# Patient Record
Sex: Male | Born: 1995 | Race: White | Hispanic: No | Marital: Single | Smoking: Never smoker
Health system: Southern US, Community
[De-identification: ages and names within clinical notes are randomized; demographics above are authoritative.]

---
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
## 2019-10-02 ENCOUNTER — Other Ambulatory Visit: Payer: Self-pay

## 2019-10-02 ENCOUNTER — Ambulatory Visit: Payer: Self-pay | Admitting: Physician Assistant

## 2019-10-02 DIAGNOSIS — Z113 Encounter for screening for infections with a predominantly sexual mode of transmission: Secondary | ICD-10-CM

## 2019-10-02 LAB — GRAM STAIN

## 2019-10-03 ENCOUNTER — Encounter: Payer: Self-pay | Admitting: Physician Assistant

## 2019-10-03 NOTE — Progress Notes (Signed)
    STI clinic/screening visit  Subjective:  Jeffrey Patrick is a 23 y.o. male being seen today for an STI screening visit. The patient reports they do not have symptoms.  Patient has the following medical conditions:  There are no active problems to display for this patient.    Chief Complaint  Patient presents with  . SEXUALLY TRANSMITTED DISEASE    HPI  Patient reports that he is not having any symptoms but would like a screening today.    See flowsheet for further details and programmatic requirements.    The following portions of the patient's history were reviewed and updated as appropriate: allergies, current medications, past medical history, past social history, past surgical history and problem list.  Objective:  There were no vitals filed for this visit.  Physical Exam Constitutional:      General: He is not in acute distress.    Appearance: Normal appearance.  HENT:     Head: Normocephalic and atraumatic.     Mouth/Throat:     Mouth: Mucous membranes are moist.     Pharynx: Oropharynx is clear. No oropharyngeal exudate or posterior oropharyngeal erythema.  Eyes:     Conjunctiva/sclera: Conjunctivae normal.  Neck:     Musculoskeletal: Neck supple.  Pulmonary:     Effort: Pulmonary effort is normal.  Abdominal:     Palpations: Abdomen is soft. There is no mass.     Tenderness: There is no abdominal tenderness. There is no guarding or rebound.  Genitourinary:    Penis: Normal.      Scrotum/Testes: Normal.     Comments: Pubic area without nits, lice, edema, erythema, lesions and inguinal adenopathy. Penis circumcised and without discharge at meatus. Lymphadenopathy:     Cervical: No cervical adenopathy.  Skin:    General: Skin is warm and dry.     Findings: No bruising, erythema, lesion or rash.  Neurological:     Mental Status: He is alert and oriented to person, place, and time.  Psychiatric:        Mood and Affect: Mood normal.        Behavior:  Behavior normal.        Thought Content: Thought content normal.        Judgment: Judgment normal.       Assessment and Plan:  Jeffrey Patrick is a 23 y.o. male presenting to the [facility] for STI screening  1. Screening for STD (sexually transmitted disease) Patient into clinic for screening and is without symptoms.  Rec condoms with all sex. Reviewed Gram stain results and no treatment indicated today. Await test results.  Counseled that RN will call if needs to RTC for any treatment once results are back. - Gram stain - HIV [facility] - Syphilis Serology, [facility] - Gonococcus culture     No follow-ups on file.  No future appointments.  Jerene Dilling, PA

## 2019-10-06 LAB — GONOCOCCUS CULTURE

## 2020-04-19 ENCOUNTER — Other Ambulatory Visit: Payer: Self-pay

## 2020-04-19 ENCOUNTER — Ambulatory Visit
Admission: EM | Admit: 2020-04-19 | Discharge: 2020-04-19 | Disposition: A | Discharge status: Home / Self Care | Payer: 59 | Attending: Family Medicine | Admitting: Family Medicine

## 2020-04-19 ENCOUNTER — Encounter: Payer: Self-pay | Admitting: Emergency Medicine

## 2020-04-19 DIAGNOSIS — H00014 Hordeolum externum left upper eyelid: Secondary | ICD-10-CM

## 2020-04-19 DIAGNOSIS — L03213 Periorbital cellulitis: Secondary | ICD-10-CM

## 2020-04-19 MED ORDER — AMOXICILLIN-POT CLAVULANATE 875-125 MG PO TABS: 1.0000 | ORAL_TABLET | Freq: Two times a day (BID) | ORAL | 0 refills | Status: AC

## 2020-04-19 MED ORDER — ERYTHROMYCIN 5 MG/GM OP OINT: 1.0000 "application " | TOPICAL_OINTMENT | Freq: Three times a day (TID) | OPHTHALMIC | 0 refills | Status: AC

## 2020-04-19 NOTE — Discharge Instructions (Addendum)
Take medication as prescribed. Rest. Drink plenty of fluids. Warm compresses. Avoid rubbing.   Follow-up with ophthalmology as needed.  Follow up with your primary care physician this week as needed. Return to Urgent care for new or worsening concerns.

## 2020-04-19 NOTE — ED Triage Notes (Signed)
Patient c/o left eye pain and drainage since yesterday.  Patient reports some swelling in his left upper eyelid.

## 2020-04-19 NOTE — ED Provider Notes (Signed)
[facility] ____________________________________________  Time seen: Approximately 10:13 AM  I have reviewed the triage vital signs and the nursing notes.   HISTORY  Chief Complaint Eye Pain (left)   HPI OMEED Patrick is a 24 y.o. male presenting for evaluation of left eye discomfort.  Reports since yesterday he had some discomfort to the left upper inner eyelid and states increased today with some swelling.  States the eye itself feels fine.  Denies abrupt onset.  Denies known foreign body or injury.  States he woke up with this yesterday.  Felt fine going to bed Thursday night.  Has had some intermittent drainage left eye.  Denies vision change, vision loss, cough, congestion or fevers.  Does not wear glasses or contacts.  Reports otherwise doing well.  Denies alleviating measures.  Denies aggravating factors.   History reviewed. No pertinent past medical history.  There are no problems to display for this patient.   History reviewed. No pertinent surgical history.   No current facility-administered medications for this encounter.  Current Outpatient Medications:  .  amoxicillin-clavulanate (AUGMENTIN) 875-125 MG tablet, Take 1 tablet by mouth every 12 (twelve) hours for 7 days., Disp: 14 tablet, Rfl: 0 .  erythromycin ophthalmic ointment, Place 1 application into the left eye 3 (three) times daily. For seven days, Disp: 3.5 g, Rfl: 0  Allergies Patient has no known allergies.  Family History  Problem Relation Age of Onset  . Healthy Mother   . Healthy Father     Social History Social History   Tobacco Use  . Smoking status: Never Smoker  . Smokeless tobacco: Current User    Types: Chew  Substance Use Topics  . Alcohol use: Yes    Comment: occasionally  . Drug use: Not Currently    Types: Marijuana    Review of Systems Constitutional: No fever/chills Eyes: No visual changes.  Positive eye complaints as above. ENT: No sore  throat. Cardiovascular: Denies chest pain. Respiratory: Denies shortness of breath. Musculoskeletal: Negative for back pain. Skin: Negative for rash.   ____________________________________________   PHYSICAL EXAM:  VITAL SIGNS: ED Triage Vitals  Enc Vitals Group     BP 04/19/20 0904 127/86     Pulse --      Resp 04/19/20 0904 16     Temp 04/19/20 0904 98.2 F (36.8 C)     Temp Source 04/19/20 0904 Oral     SpO2 04/19/20 0904 100 %     Weight 04/19/20 0901 194 lb (88 kg)     Height 04/19/20 0901 6' (1.829 m)     Head Circumference --      Peak Flow --      Pain Score 04/19/20 0901 2     Pain Loc --      Pain Edu? --      Excl. in GC? --       Visual Acuity  Right Eye Distance: 20/20 uncorrected Left Eye Distance: 20/20 uncorrected Bilateral Distance: 20/20 uncorrected    Constitutional: Alert and oriented. Well appearing and in no acute distress. Eyes: Conjunctivae are normal.  No foreign bodies noted bilaterally.  No discharge bilaterally.  PERRL. EOMI. no pain with EOMs.  Left upper inner eyelid stye present without active drainage, mild tenderness with mild surrounding medial erythema, no further surrounding erythema, no further tenderness, medial mild edema to eyelid, no other edema. ENT      Head: Normocephalic and atraumatic. Respiratory: Normal respiratory effort without tachypnea nor retractions.  Musculoskeletal: Steady gait Neurologic:  Normal speech and language.  Speech is normal. No gait instability.  Skin:  Skin is warm, dry and intact. No rash noted. Psychiatric: Mood and affect are normal. Speech and behavior are normal. Patient exhibits appropriate insight and judgment   ___________________________________________   LABS (all labs ordered are listed, but only abnormal results are displayed)  Labs Reviewed - No data to display   PROCEDURES Procedures    INITIAL IMPRESSION / ASSESSMENT AND PLAN / ED COURSE  Pertinent labs & imaging results  that were available during my care of the patient were reviewed by me and considered in my medical decision making (see chart for details).  Well-appearing patient.  Left eye eyelid stye present, concern for mild secondary preseptal cellulitis.  Will treat with oral Augmentin and erythromycin ophthalmic ointment.  Encourage warm compresses, keeping clean and close monitoring.  Discussed strict follow-up and return parameters.Discussed indication, risks and benefits of medications with patient.   Discussed follow up with Primary care physician or ophthalmology this week as needed.  Discussed follow up and return parameters including no resolution or any worsening concerns. Patient verbalized understanding and agreed to plan.   ____________________________________________   FINAL CLINICAL IMPRESSION(S) / ED DIAGNOSES  Final diagnoses:  Hordeolum externum of left upper eyelid  Preseptal cellulitis of left upper eyelid     ED Discharge Orders         Ordered    amoxicillin-clavulanate (AUGMENTIN) 875-125 MG tablet  Every 12 hours     04/19/20 0931    erythromycin ophthalmic ointment  3 times daily     04/19/20 0931           Note: This dictation was prepared with Dragon dictation along with smaller phrase technology. Any transcriptional errors that result from this process are unintentional.         Marylene Land, NP 04/19/20 1149

## 2023-03-31 ENCOUNTER — Emergency Department: Payer: 59

## 2023-03-31 ENCOUNTER — Encounter: Payer: Self-pay | Admitting: Radiology

## 2023-03-31 ENCOUNTER — Emergency Department
Admission: EM | Admit: 2023-03-31 | Discharge: 2023-03-31 | Disposition: A | Discharge status: Home / Self Care | Payer: 59 | Attending: Emergency Medicine | Admitting: Emergency Medicine

## 2023-03-31 ENCOUNTER — Other Ambulatory Visit: Payer: Self-pay

## 2023-03-31 DIAGNOSIS — R109 Unspecified abdominal pain: Secondary | ICD-10-CM | POA: Insufficient documentation

## 2023-03-31 DIAGNOSIS — M549 Dorsalgia, unspecified: Secondary | ICD-10-CM | POA: Insufficient documentation

## 2023-03-31 LAB — CBC WITH DIFFERENTIAL/PLATELET
Abs Immature Granulocytes: 0.01 10*3/uL (ref 0.00–0.07)
Basophils Absolute: 0 10*3/uL (ref 0.0–0.1)
Basophils Relative: 1 %
Eosinophils Absolute: 0.4 10*3/uL (ref 0.0–0.5)
Eosinophils Relative: 9 %
HCT: 43.8 % (ref 39.0–52.0)
Hemoglobin: 15 g/dL (ref 13.0–17.0)
Immature Granulocytes: 0 %
Lymphocytes Relative: 43 %
Lymphs Abs: 2.1 10*3/uL (ref 0.7–4.0)
MCH: 30.1 pg (ref 26.0–34.0)
MCHC: 34.2 g/dL (ref 30.0–36.0)
MCV: 87.8 fL (ref 80.0–100.0)
Monocytes Absolute: 0.4 10*3/uL (ref 0.1–1.0)
Monocytes Relative: 7 %
Neutro Abs: 1.9 10*3/uL (ref 1.7–7.7)
Neutrophils Relative %: 40 %
Platelets: 251 10*3/uL (ref 150–400)
RBC: 4.99 MIL/uL (ref 4.22–5.81)
RDW: 12.5 % (ref 11.5–15.5)
WBC: 4.8 10*3/uL (ref 4.0–10.5)
nRBC: 0 % (ref 0.0–0.2)

## 2023-03-31 LAB — COMPREHENSIVE METABOLIC PANEL
ALT: 40 U/L (ref 0–44)
AST: 28 U/L (ref 15–41)
Albumin: 4.6 g/dL (ref 3.5–5.0)
Alkaline Phosphatase: 71 U/L (ref 38–126)
Anion gap: 7 (ref 5–15)
BUN: 11 mg/dL (ref 6–20)
CO2: 28 mmol/L (ref 22–32)
Calcium: 9.9 mg/dL (ref 8.9–10.3)
Chloride: 106 mmol/L (ref 98–111)
Creatinine, Ser: 0.97 mg/dL (ref 0.61–1.24)
GFR, Estimated: >60 mL/min (ref >60)
Glucose, Bld: 110 mg/dL — ABNORMAL HIGH (ref 70–99)
Potassium: 4.1 mmol/L (ref 3.5–5.1)
Sodium: 141 mmol/L (ref 135–145)
Total Bilirubin: 0.7 mg/dL (ref 0.3–1.2)
Total Protein: 7.5 g/dL (ref 6.5–8.1)

## 2023-03-31 LAB — URINALYSIS, ROUTINE W REFLEX MICROSCOPIC
Bilirubin Urine: NEGATIVE
Glucose, UA: NEGATIVE mg/dL
Hgb urine dipstick: NEGATIVE
Ketones, ur: NEGATIVE mg/dL
Leukocytes,Ua: NEGATIVE
Nitrite: NEGATIVE
Protein, ur: NEGATIVE mg/dL
Specific Gravity, Urine: 1.035 — ABNORMAL HIGH (ref 1.005–1.030)
pH: 5 (ref 5.0–8.0)

## 2023-03-31 MED ORDER — MELOXICAM 15 MG PO TABS: 15.0000 mg | ORAL_TABLET | Freq: Every day | ORAL | 0 refills | Status: AC

## 2023-03-31 MED ORDER — METHOCARBAMOL 500 MG PO TABS
1000.0000 mg | ORAL_TABLET | Freq: Once | ORAL | Status: AC
  Administered 2023-03-31: 1000 mg via ORAL
  Filled 2023-03-31: qty 2

## 2023-03-31 MED ORDER — DICYCLOMINE HCL 10 MG PO CAPS
10.0000 mg | ORAL_CAPSULE | Freq: Once | ORAL | Status: AC
  Administered 2023-03-31: 10 mg via ORAL
  Filled 2023-03-31: qty 1

## 2023-03-31 MED ORDER — KETOROLAC TROMETHAMINE 30 MG/ML IJ SOLN
30.0000 mg | Freq: Once | INTRAMUSCULAR | Status: AC
  Administered 2023-03-31: 30 mg via INTRAVENOUS
  Filled 2023-03-31: qty 1

## 2023-03-31 MED ORDER — IOHEXOL 300 MG/ML  SOLN
100.0000 mL | Freq: Once | INTRAMUSCULAR | Status: AC | PRN
  Administered 2023-03-31: 100 mL via INTRAVENOUS

## 2023-03-31 MED ORDER — METHOCARBAMOL 500 MG PO TABS: 500.0000 mg | ORAL_TABLET | Freq: Four times a day (QID) | ORAL | 0 refills | Status: AC

## 2023-03-31 MED ORDER — DICYCLOMINE HCL 10 MG PO CAPS: 10.0000 mg | ORAL_CAPSULE | Freq: Three times a day (TID) | ORAL | 0 refills | Status: AC

## 2023-03-31 NOTE — ED Provider Triage Note (Signed)
Emergency Medicine Provider Triage Evaluation Note  Jeffrey Patrick , a 27 y.o. male  was evaluated in triage.  Pt complains of right lower quadrant pain.  Sent over by [facility].  Symptoms started last night.  No fever or chills.  No vomiting or diarrhea.  No history of kidney stones.  Review of Systems  Positive:  Negative:   Physical Exam  Pulse 86   Resp 18   Ht  (1.803 m)   Wt 90.7 kg   SpO2 100%   BMI 27.89 kg/m  Gen:   Awake, no distress   Resp:  Normal effort  MSK:   Moves extremities without difficulty  Other:  Patient is tender in the right lower quadrant  Medical Decision Making  Medically screening exam initiated at 2:49 PM.  Appropriate orders placed.  Muhannad Bignell Hsia was informed that the remainder of the evaluation will be completed by another provider, this initial triage assessment does not replace that evaluation, and the importance of remaining in the ED until their evaluation is complete.  Labs and an IV placed for CT abdomen pelvis   Faythe Ghee, PA-C 03/31/23 1450

## 2023-03-31 NOTE — ED Triage Notes (Signed)
Pt to ED for RLQ pain started yesterday. Tender with palpation. Denies n/v/d.

## 2023-03-31 NOTE — ED Provider Notes (Signed)
[facility] Provider Note  Patient Contact: 7:11 PM (approximate)   History   Abdominal Pain   HPI  Jeffrey Patrick is a 27 y.o. male who presents emergency department complaining of right back/flank/hip pain.  Patient states that he went to urgent care today after having several days of pain that seems to radiate from his back to his pelvic crest.  He has no abdominal pain, nausea, vomiting, diarrhea, constipation.  No dysuria, polyuria, hematuria.  Still has his appendix.  Still has his gallbladder.  No history of kidney stones.  Patient went to her urgent care and was referred to the ED to ensure he did not have appendicitis.     Physical Exam   Triage Vital Signs: ED Triage Vitals  Enc Vitals Group     BP 03/31/23 1449 (!) 157/91     Pulse Rate 03/31/23 1448 86     Resp 03/31/23 1448 18     Temp 03/31/23 1450 98.2 F (36.8 C)     Temp src --      SpO2 03/31/23 1448 100 %     Weight 03/31/23 1448 200 lb (90.7 kg)     Height 03/31/23 1448  (1.803 m)     Head Circumference --      Peak Flow --      Pain Score 03/31/23 1448 6     Pain Loc --      Pain Edu? --      Excl. in GC? --     Most recent vital signs: Vitals:   03/31/23 1449 03/31/23 1450  BP: (!) 157/91   Pulse:    Resp:    Temp:  98.2 F (36.8 C)  SpO2:       General: Alert and in no acute distress.  Cardiovascular:  Good peripheral perfusion Respiratory: Normal respiratory effort without tachypnea or retractions. Lungs CTAB.  Gastrointestinal: Bowel sounds 4 quadrants. Soft and nontender to palpation. No guarding or rigidity. No palpable masses. No distention. No CVA tenderness. Musculoskeletal: Full range of motion to all extremities.  Neurologic:  No gross focal neurologic deficits are appreciated.  Skin:   No rash noted Other:   ED Results / Procedures / Treatments   Labs (all labs ordered are listed, but only abnormal results are displayed) Labs Reviewed   COMPREHENSIVE METABOLIC PANEL - Abnormal; Notable for the following components:      Result Value   Glucose, Bld 110 (*)    All other components within normal limits  URINALYSIS, ROUTINE W REFLEX MICROSCOPIC - Abnormal; Notable for the following components:   Color, Urine STRAW (*)    APPearance CLEAR (*)    Specific Gravity, Urine 1.035 (*)    All other components within normal limits  CBC WITH DIFFERENTIAL/PLATELET     EKG     RADIOLOGY  I personally viewed, evaluated, and interpreted these images as part of my medical decision making, as well as reviewing the written report by the radiologist.  ED Provider Interpretation: No acute intra-abdominal findings on CT scan of the abdomen pelvis.  CT ABDOMEN PELVIS W CONTRAST  Result Date: 03/31/2023 CLINICAL DATA:  Right lower quadrant abdominal pain EXAM: CT ABDOMEN AND PELVIS WITH CONTRAST TECHNIQUE: Multidetector CT imaging of the abdomen and pelvis was performed using the standard protocol following bolus administration of intravenous contrast. RADIATION DOSE REDUCTION: This exam was performed according to the departmental dose-optimization program which includes automated exposure control, adjustment of the mA  and/or kV according to patient size and/or use of iterative reconstruction technique. CONTRAST:  OMNIPAQUE IOHEXOL 300 MG/ML  SOLN COMPARISON:  None Available. FINDINGS: Lower chest: No acute abnormality. Hepatobiliary: No focal liver abnormality is seen. No gallstones, gallbladder wall thickening, or biliary dilatation. Pancreas: Unremarkable. No pancreatic ductal dilatation or surrounding inflammatory changes. Spleen: Normal in size without focal abnormality. Adrenals/Urinary Tract: Normal adrenal glands. No nephrolithiasis, hydronephrosis or suspicious mass. No signs of hydroureter or ureteral lithiasis. Urinary bladder appears normal. Stomach/Bowel: Normal appearance of the stomach. The appendix is visualized and is  normal. No bowel wall thickening, inflammation, or distension. Vascular/Lymphatic: No significant vascular findings are present. No enlarged abdominal or pelvic lymph nodes. Reproductive: Prostate is unremarkable. Other: No free fluid or fluid collections identified. No signs of pneumoperitoneum. Musculoskeletal: No acute or significant osseous findings. IMPRESSION: 1. No acute findings within the abdomen or pelvis. 2. The appendix is visualized and is normal. Electronically Signed   By: Signa Kell M.D.   On: 03/31/2023 15:47    PROCEDURES:  Critical Care performed: No  Procedures   MEDICATIONS ORDERED IN ED: Medications  ketorolac (TORADOL) 30 MG/ML injection 30 mg (has no administration in time range)  methocarbamol (ROBAXIN) tablet 1,000 mg (has no administration in time range)  dicyclomine (BENTYL) capsule 10 mg (has no administration in time range)  iohexol (OMNIPAQUE) 300 MG/ML solution 100 mL (100 mLs Intravenous Contrast Given 03/31/23 1535)     IMPRESSION / MDM / ASSESSMENT AND PLAN / ED COURSE  I reviewed the triage vital signs and the nursing notes.                                 Differential diagnosis includes, but is not limited to, lumbago, sciatica, nephrolithiasis, pyelonephritis, appendicitis, shingles  Patient's presentation is most consistent with acute presentation with potential threat to life or bodily function.   Patient's diagnosis is consistent with back pain.  Patient presents emergency department with right back/flank pain.  Patient went to urgent care, had a reassuring urine but urgent care was concerned that patient may have appendicitis and referred him to the emergency department.  There is no abdominal pain.  No tenderness.  Patient has findings more consistent with musculoskeletal back pain.  Labs, imaging, urine are reassuring with no acute signs of infection or other intra-abdominal concerning findings on imaging.  As such patient will be prescribed  symptom control medication.  Follow-up with primary care as needed.  Return precautions discussed with patient.. Patient is given ED precautions to return to the ED for any worsening or new symptoms.     FINAL CLINICAL IMPRESSION(S) / ED DIAGNOSES   Final diagnoses:  Other acute back pain     Rx / DC Orders   ED Discharge Orders          Ordered    meloxicam (MOBIC) 15 MG tablet  Daily        03/31/23 1915    methocarbamol (ROBAXIN) 500 MG tablet  4 times daily        03/31/23 1915    dicyclomine (BENTYL) 10 MG capsule  3 times daily before meals & bedtime        03/31/23 1915             Note:  This document was prepared using Dragon voice recognition software and may include unintentional dictation errors.   Racheal Patches, PA-C 03/31/23  1610    Sharman Cheek, MD 04/02/23 [phone]

## 2023-03-31 NOTE — ED Triage Notes (Addendum)
Pt to ED via [facility]. RLQ pain x1 day. Pt has urinalysis at [facility] that was negative. [facility] sent to rule out appendicitis.
